# Patient Record
Sex: Female | Born: 1960 | Hispanic: No | Marital: Single | Smoking: Former smoker
Health system: Southern US, Community
[De-identification: ages and names within clinical notes are randomized; demographics above are authoritative.]

---

## 1998-05-12 HISTORY — PX: TUBAL LIGATION: SHX77

## 1998-06-10 ENCOUNTER — Inpatient Hospital Stay (HOSPITAL_COMMUNITY): Admission: AD | Admit: 1998-06-10 | Discharge: 1998-06-12 | Payer: Self-pay | Admitting: Obstetrics and Gynecology

## 1998-08-08 ENCOUNTER — Other Ambulatory Visit: Admission: RE | Admit: 1998-08-08 | Discharge: 1998-08-08 | Payer: Self-pay | Admitting: *Deleted

## 2000-06-04 ENCOUNTER — Encounter: Payer: Self-pay | Admitting: Internal Medicine

## 2000-06-04 ENCOUNTER — Emergency Department (HOSPITAL_COMMUNITY): Admission: EM | Admit: 2000-06-04 | Discharge: 2000-06-04 | Payer: Self-pay | Admitting: Internal Medicine

## 2000-12-05 ENCOUNTER — Ambulatory Visit (HOSPITAL_COMMUNITY): Admission: RE | Admit: 2000-12-05 | Discharge: 2000-12-05 | Payer: Self-pay | Admitting: Family Medicine

## 2000-12-05 ENCOUNTER — Encounter: Payer: Self-pay | Admitting: Family Medicine

## 2001-02-11 ENCOUNTER — Ambulatory Visit (HOSPITAL_COMMUNITY): Admission: RE | Admit: 2001-02-11 | Discharge: 2001-02-11 | Payer: Self-pay | Admitting: Internal Medicine

## 2001-02-11 ENCOUNTER — Encounter: Payer: Self-pay | Admitting: Internal Medicine

## 2006-02-05 ENCOUNTER — Emergency Department (HOSPITAL_COMMUNITY): Admission: EM | Admit: 2006-02-05 | Discharge: 2006-02-05 | Payer: Self-pay | Admitting: Family Medicine

## 2008-07-25 ENCOUNTER — Ambulatory Visit: Payer: Self-pay | Admitting: Occupational Medicine

## 2008-07-25 LAB — CONVERTED CEMR LAB
Bilirubin Urine: NEGATIVE
Glucose, Urine, Semiquant: NEGATIVE
Ketones, urine, test strip: NEGATIVE
Nitrite: NEGATIVE
Protein, U semiquant: NEGATIVE
Specific Gravity, Urine: 1.01
Urobilinogen, UA: 0.2
WBC Urine, dipstick: NEGATIVE
pH: 6

## 2008-08-23 ENCOUNTER — Ambulatory Visit: Payer: Self-pay | Admitting: Occupational Medicine

## 2008-08-23 DIAGNOSIS — S92919A Unspecified fracture of unspecified toe(s), initial encounter for closed fracture: Secondary | ICD-10-CM | POA: Insufficient documentation

## 2009-09-19 IMAGING — CR DG TOE 5TH 2+V*L*
1 series · 1 of 1 positions shown · non-contrast
Comparison: 08/23/2008 at 6777 hours

CLINICAL DATA: Post reduction views

LEFT TOE - 2+ VIEW

[view not recorded]
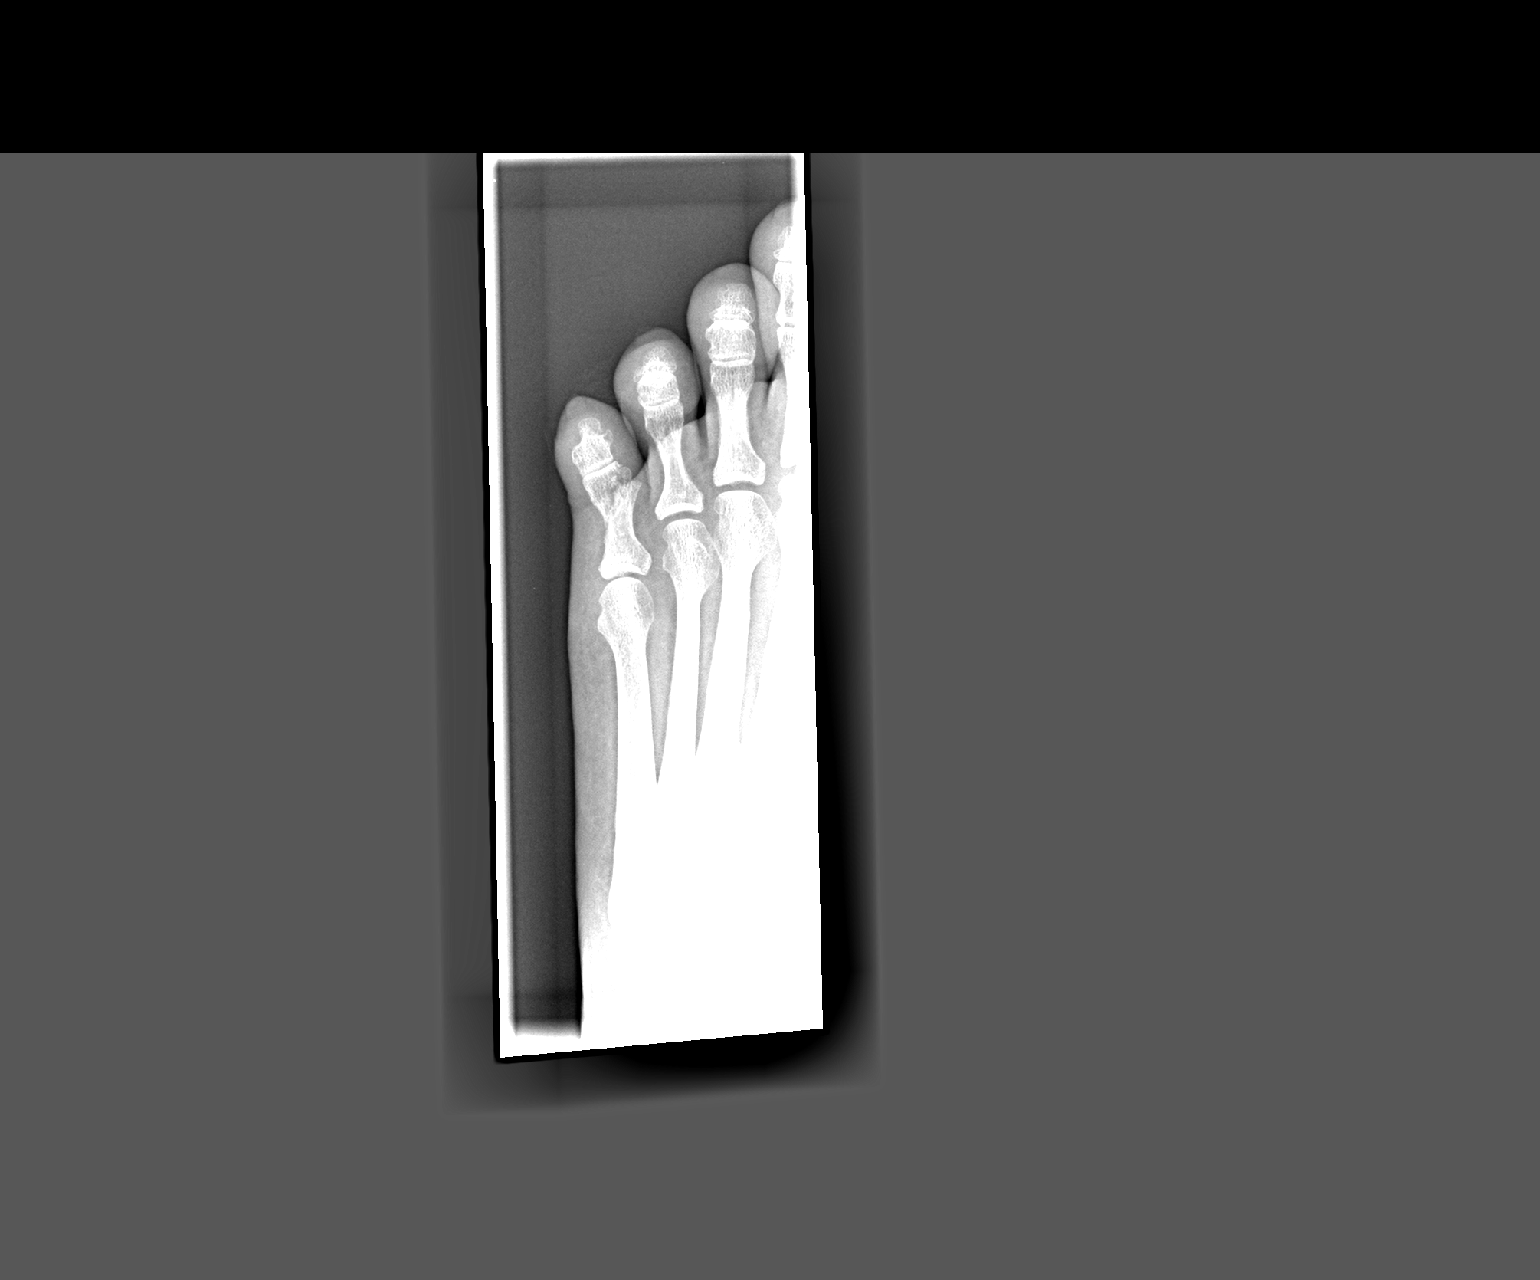

[1 of 1 positions shown; findings below may reference images not displayed]

FINDINGS: An AP views shows slightly less angulation of the
fracture involving the proximal phalanx of the fifth digit.
IMPRESSION: Slightly less angulation of the fracture of the proximal phalanx of
the fifth toe, post reduction.

## 2010-03-18 ENCOUNTER — Ambulatory Visit: Payer: Self-pay | Admitting: Cardiology

## 2014-05-15 ENCOUNTER — Ambulatory Visit (INDEPENDENT_AMBULATORY_CARE_PROVIDER_SITE_OTHER): Payer: 59 | Admitting: Obstetrics & Gynecology

## 2014-05-15 ENCOUNTER — Encounter: Payer: Self-pay | Admitting: Obstetrics & Gynecology

## 2014-05-15 VITALS — BP 160/94 | HR 99 | Resp 16 | Ht 64.0 in | Wt 178.0 lb

## 2014-05-15 DIAGNOSIS — I10 Essential (primary) hypertension: Secondary | ICD-10-CM

## 2014-05-15 DIAGNOSIS — Z01419 Encounter for gynecological examination (general) (routine) without abnormal findings: Secondary | ICD-10-CM

## 2014-05-15 DIAGNOSIS — R102 Pelvic and perineal pain: Secondary | ICD-10-CM

## 2014-05-15 DIAGNOSIS — Z124 Encounter for screening for malignant neoplasm of cervix: Secondary | ICD-10-CM

## 2014-05-15 DIAGNOSIS — Z1151 Encounter for screening for human papillomavirus (HPV): Secondary | ICD-10-CM

## 2014-05-15 NOTE — Progress Notes (Signed)
  Subjective:    Nichole Abbott is a 54 y.o. female who presents for an annual exam. The patient complaints of central pelvic pain starting in November 2015 and continued to worsen and become more constant.  Pt denies pain with BM or urination.  No N/V/D; no blod in stools, no constipation.  Pt is menopausal for 3 years approximately.  She has had menopasual symptoms for approximately 5 years.  Pt has hd no PMB.  P had not accessed the health care system for many years.  She has never had a mamogram, no poap for 16 years.   No primary care MD. The patient is not currently sexually active. GYN screening history: no pap for 16 years.  No history of mammogram. The patient wears seatbelts: yes. The patient participates in regular exercise: no. Has the patient ever been transfused or tattooed?: not asked.   Menstrual History: OB History    Gravida Para Term Preterm AB TAB SAB Ectopic Multiple Living   No LMP recorded. Patient is postmenopausal.    The following portions of the patient's history were reviewed and updated as appropriate: allergies, current medications, past family history, past medical history, past social history, past surgical history and problem list.  Review of Systems Pertinent items are noted in HPI.    Objective:      Filed Vitals:   05/15/14 1334  BP: 160/94  Pulse: 99  Resp: 16  Height:  (1.626 m)  Weight: 178 lb (80.74 kg)   Vitals:  WNL General appearance: alert, cooperative and no distress Head: Normocephalic, without obvious abnormality, atraumatic Eyes: negative Throat: lips, mucosa, and tongue normal; teeth and gums normal Lungs: clear to auscultation bilaterally Breasts: normal appearance, no masses or tenderness, No nipple retraction or dimpling, No nipple discharge or bleeding Heart: regular rate and rhythm Abdomen: soft, non-tender; bowel sounds normal; no masses,  no organomegaly  Pelvic:  External Genitalia:  Tanner V, no  lesion Urethra:  No prolapse Vagina:  Pink, normal rugae, no blood or discharge Cervix:  No CMT, no lesion Uterus:  Normal size and contour, midly tender--very limited by body habitus Adnexa:  Normal, no masses, mildly tender Rectovaginal Septum:  Non tender, no masses  Extremities: no edema, redness or tenderness in the calves or thighs Skin: no lesions or rash Lymph nodes: Axillary adenopathy: none     .    Assessment:    Healthy female exam.    Plan:     Pap with co testing Mammogram Referral to PCP for HTN Pelvic and TV US

## 2014-05-17 LAB — CYTOLOGY - PAP

## 2014-05-24 ENCOUNTER — Ambulatory Visit (INDEPENDENT_AMBULATORY_CARE_PROVIDER_SITE_OTHER): Payer: 59

## 2014-05-24 DIAGNOSIS — R102 Pelvic and perineal pain: Secondary | ICD-10-CM

## 2014-05-24 DIAGNOSIS — R350 Frequency of micturition: Secondary | ICD-10-CM

## 2014-05-24 DIAGNOSIS — Z1231 Encounter for screening mammogram for malignant neoplasm of breast: Secondary | ICD-10-CM

## 2014-05-30 ENCOUNTER — Encounter: Payer: Self-pay | Admitting: Obstetrics & Gynecology

## 2014-05-30 DIAGNOSIS — R9389 Abnormal findings on diagnostic imaging of other specified body structures: Secondary | ICD-10-CM | POA: Insufficient documentation

## 2014-05-31 ENCOUNTER — Telehealth: Payer: Self-pay | Admitting: *Deleted

## 2014-05-31 DIAGNOSIS — Z01812 Encounter for preprocedural laboratory examination: Secondary | ICD-10-CM

## 2014-05-31 MED ORDER — MISOPROSTOL 200 MCG PO TABS: 200.0000 ug | ORAL_TABLET | Freq: Once | ORAL | Status: DC

## 2014-05-31 NOTE — Telephone Encounter (Signed)
-----   Message from Lesly DukesKelly H Leggett, MD sent at 05/30/2014  6:23 PM EST ----- US showed nml size uterus and no adnexal masses.  Ovaries could not visualized with confidence but not abnml is post menopausal setting.  However, lining is 5mm which is abnml in the p-m setting and needs a biopsy.  Pt needs cytotec 400 mcg prior to procedure.

## 2014-05-31 NOTE — Telephone Encounter (Signed)
-----   Message from Kelly H Leggett, MD sent at 05/30/2014  6:23 PM EST ----- US showed nml size uterus and no adnexal masses.  Ovaries could not visualized with confidence but not abnml is post menopausal setting.  However, lining is 5mm which is abnml in the p-m setting and needs a biopsy.  Pt needs cytotec 400 mcg prior to procedure. 

## 2014-06-01 ENCOUNTER — Telehealth: Payer: Self-pay | Admitting: *Deleted

## 2014-06-01 NOTE — Telephone Encounter (Signed)
Left another message on voicemail to call office to discuss test results.  Pt is going to need Endo BX

## 2014-06-13 ENCOUNTER — Encounter: Payer: Self-pay | Admitting: Obstetrics & Gynecology

## 2014-06-13 ENCOUNTER — Ambulatory Visit (INDEPENDENT_AMBULATORY_CARE_PROVIDER_SITE_OTHER): Payer: 59 | Admitting: Obstetrics & Gynecology

## 2014-06-13 VITALS — BP 159/96 | HR 112 | Resp 16 | Ht 64.0 in | Wt 179.0 lb

## 2014-06-13 DIAGNOSIS — Z0000 Encounter for general adult medical examination without abnormal findings: Secondary | ICD-10-CM

## 2014-06-13 DIAGNOSIS — Z01812 Encounter for preprocedural laboratory examination: Secondary | ICD-10-CM

## 2014-06-13 DIAGNOSIS — R938 Abnormal findings on diagnostic imaging of other specified body structures: Secondary | ICD-10-CM

## 2014-06-13 DIAGNOSIS — I10 Essential (primary) hypertension: Secondary | ICD-10-CM

## 2014-06-13 NOTE — Progress Notes (Signed)
   Subjective:    Patient ID: Nichole Abbott, female    DOB: [birth year], 54 y.o.   MRN: [id number]  HPI Pt here for endometrial biopsy.  Pt still has pelvic pain.  US was nml uterine contour and no ovarian masses.  Lining noted to be thickened.  Needs sampling.  Pt has hypertension.  Needs primary care referral and needs colonoscopy.     Review of Systems    Continued abdominal pain No PMB  Objective:   Physical Exam  Constitutional: She is oriented to person, place, and time. She appears well-developed and well-nourished.  HENT:  Head: Normocephalic and atraumatic.  Eyes: Conjunctivae are normal.  Pulmonary/Chest: Effort normal.  Abdominal: Soft. She exhibits no distension. There is no tenderness.  Genitourinary:  Pale pink vagina Cervix--no lesion Nml uterus.  Musculoskeletal: She exhibits no edema.  Neurological: She is alert and oriented to person, place, and time.  Skin: Skin is warm and dry.  Psychiatric: She has a normal mood and affect.  Vitals reviewed.   Filed Vitals:   06/13/14 1445  BP: 159/96  Pulse: 112  Resp: 16  Height: 5\' 4"  (1.626 m)  Weight: 179 lb (81.194 kg)    ENDOMETRIAL BIOPSY     The indications for endometrial biopsy were reviewed.   Risks of the biopsy including cramping, bleeding, infection, uterine perforation, inadequate specimen and need for additional procedures  were discussed. The patient states she understands and agrees to undergo procedure today. Consent was signed. Time out was performed.  A sterile speculum was placed in the patient's vagina and the cervix was prepped with Betadine. A single-toothed tenaculum was placed on the anterior lip of the cervix to stabilize it. The 3 mm pipelle was introduced into the endometrial cavity without difficulty to a depth of 8.5cm, and a moderate amount of tissue was obtained and sent to pathology. The instruments were removed from the patient's vagina. Minimal bleeding from the cervix was noted. The  patient tolerated the procedure well. Routine post-procedure instructions were given to the patient. The patient will follow up to review the results and for further management.         Assessment & Plan:  54 yo female with thickened endometrium on US Endometrial biopsy today. Needs primary care referral for hypertension and abdominal pain.  Colonoscopy. Will call with results. Provera 14 days x 2 months if nml

## 2014-06-19 ENCOUNTER — Other Ambulatory Visit: Payer: Self-pay | Admitting: Obstetrics & Gynecology

## 2014-06-19 MED ORDER — MEDROXYPROGESTERONE ACETATE 10 MG PO TABS: ORAL_TABLET | ORAL | Status: DC

## 2014-06-20 ENCOUNTER — Telehealth: Payer: Self-pay | Admitting: *Deleted

## 2014-06-20 NOTE — Telephone Encounter (Signed)
-----   Message from Lesly DukesKelly H Leggett, MD sent at 06/19/2014  9:58 PM EST ----- Nml endometrial biopsy.  Treat with provera x 14 days for two months.

## 2014-06-20 NOTE — Telephone Encounter (Signed)
Called pt to adv endo bx nrml and provera sent to pharm. Pt expressed understanding.

## 2015-06-20 IMAGING — US US PELVIS COMPLETE
1 series · 14 of 25 positions shown · non-contrast
Comparison: None

CLINICAL DATA: Pelvic pain in female. Urinary frequency.
Postmenopausal female.

EXAM:
TRANSABDOMINAL AND TRANSVAGINAL ULTRASOUND OF PELVIS
TECHNIQUE: Both transabdominal and transvaginal ultrasound examinations of the
pelvis were performed. Transabdominal technique was performed for
global imaging of the pelvis including uterus, ovaries, adnexal
regions, and pelvic cul-de-sac. It was necessary to proceed with
endovaginal exam following the transabdominal exam to visualize the
endometrium and ovaries.

[Series 1: us pelvis complete · 0.21mm/px · 14 of 43 slices shown]
[im 1/43]
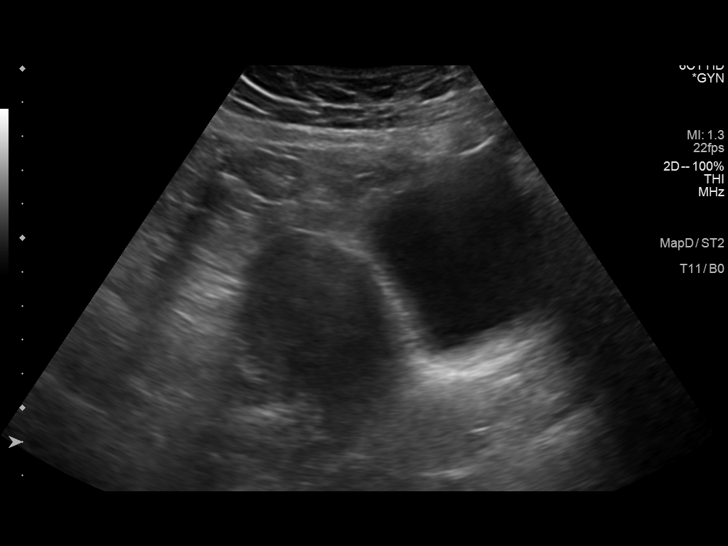
[im 4/43]
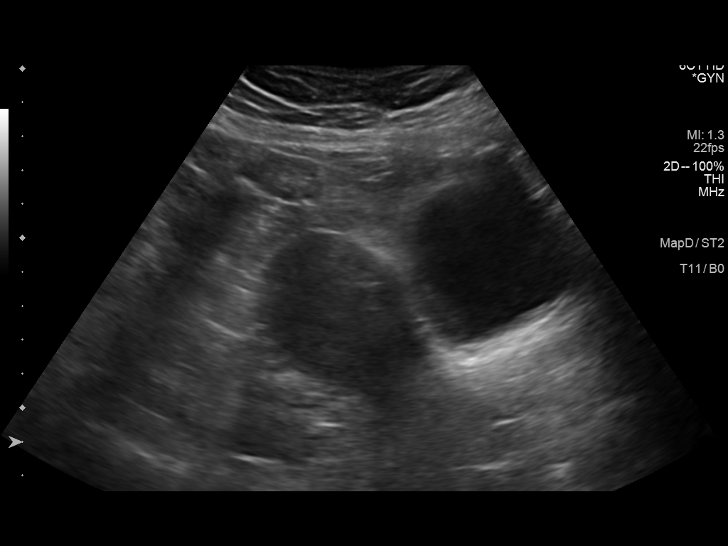
[im 8/43]
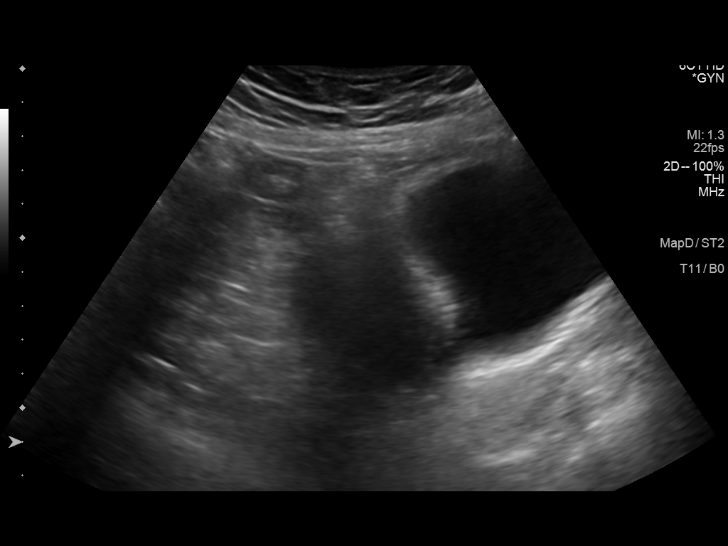
[im 11/43]
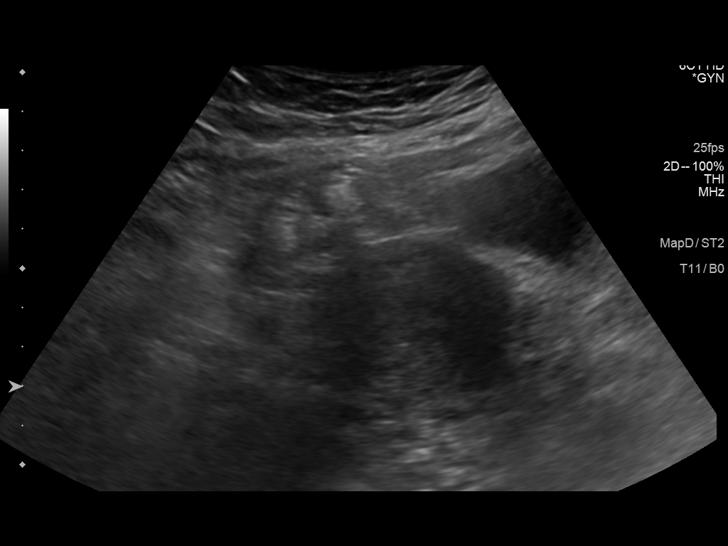
[im 15/43]
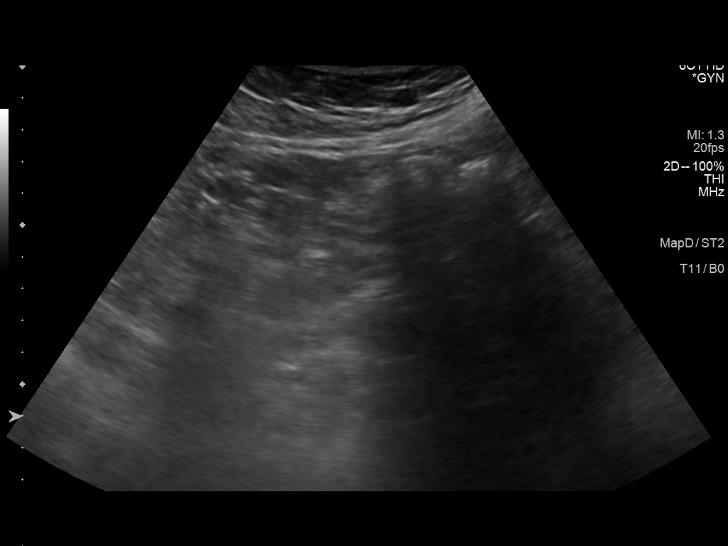
[im 16/43]
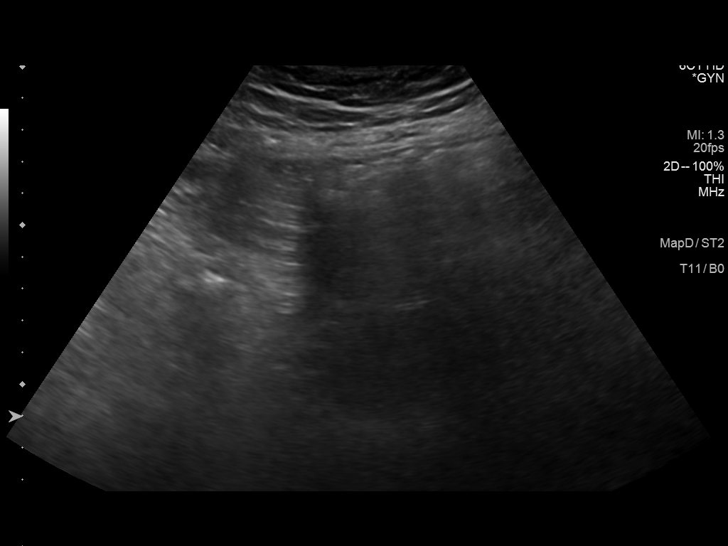
[im 20/43]
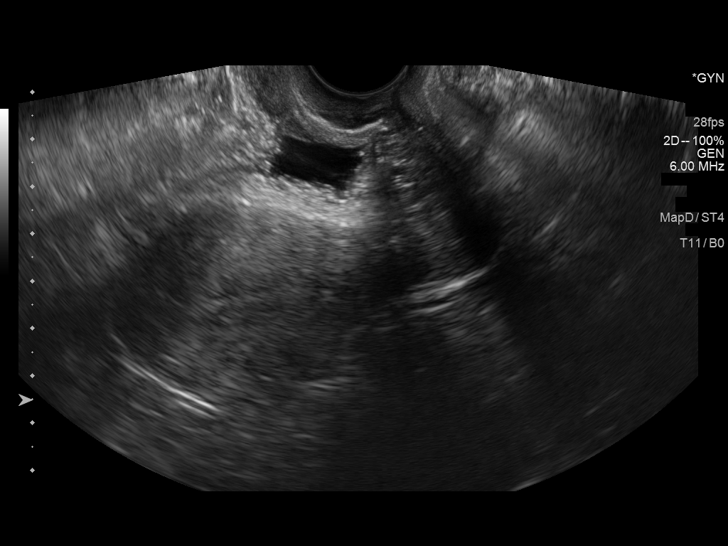
[im 23/43]
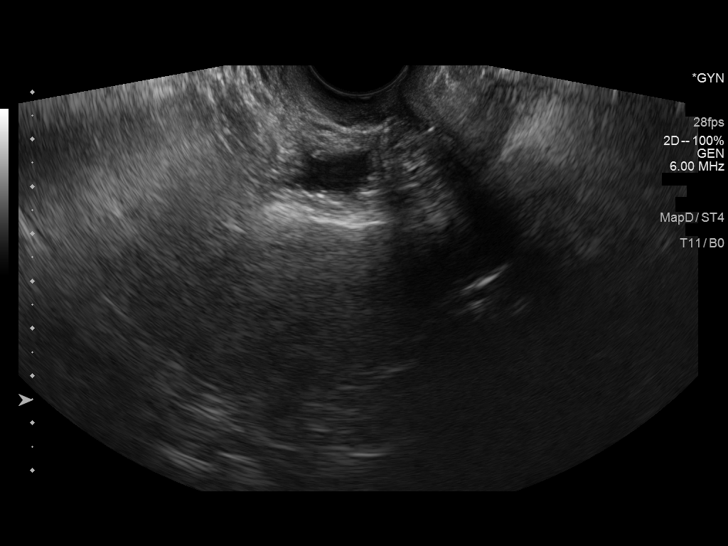
[im 27/43]
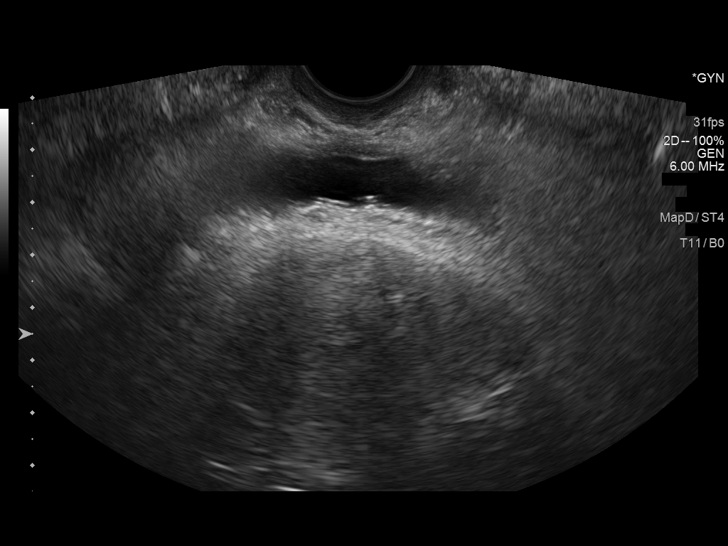
[im 29/43]
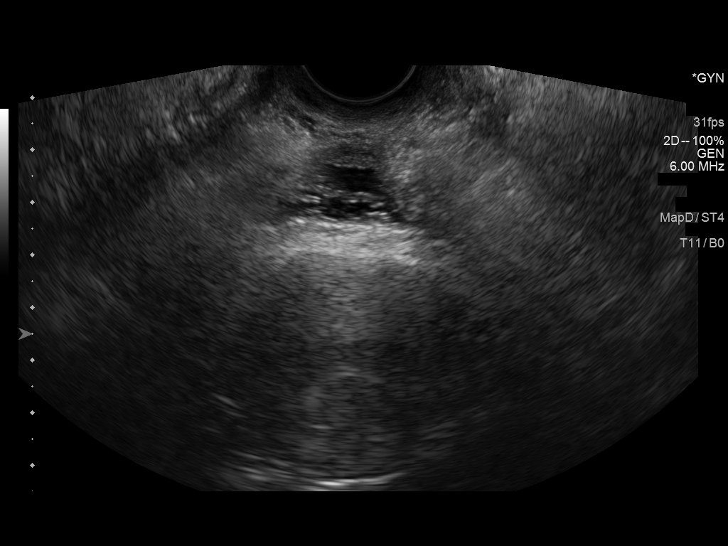
[im 32/43]
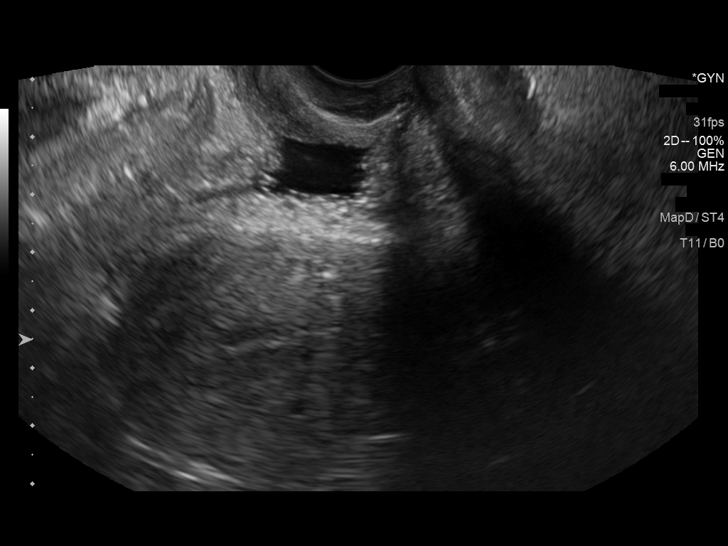
[im 36/43]
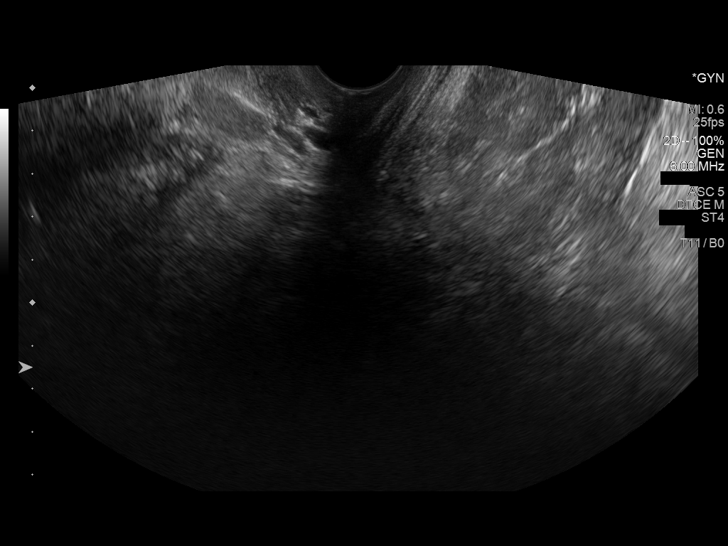
[im 39/43]
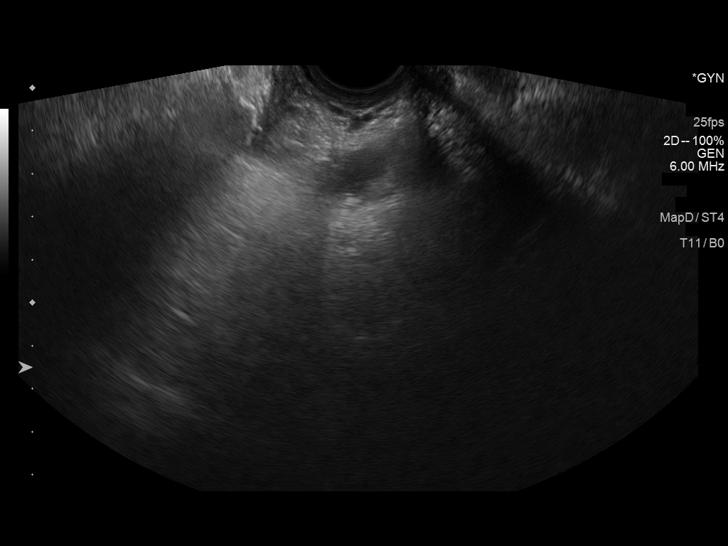
[im 43/43]
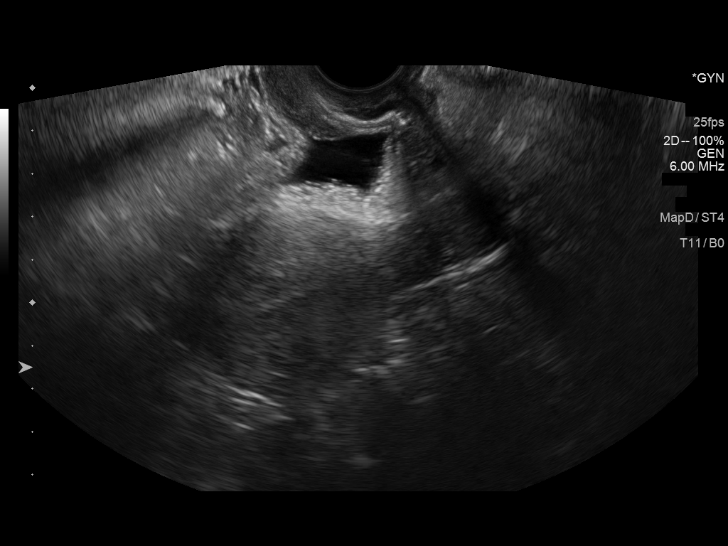

[14 of 25 positions shown; findings below may reference images not displayed]

FINDINGS: Uterus

Measurements: 8.8 x 3.8 x 4.9 cm. No fibroids or other mass
visualized.

Endometrium

Thickness: 5 mm.  No focal abnormality visualized.

Right ovary

Measurements: Not directly visualized by transabdominal or
transvaginal sonography, however no adnexal mass identified.

Left ovary

Measurements: Not directly visualized by transabdominal or
transvaginal sonography, however no adnexal mass identified.

Other findings

No free fluid.
IMPRESSION: Normal appearance of uterus.  No fibroids identified.

Nonvisualization of ovaries, however no adnexal mass identified.

## 2015-08-23 ENCOUNTER — Ambulatory Visit (INDEPENDENT_AMBULATORY_CARE_PROVIDER_SITE_OTHER): Payer: 59 | Admitting: Osteopathic Medicine

## 2015-08-23 ENCOUNTER — Encounter: Payer: Self-pay | Admitting: Osteopathic Medicine

## 2015-08-23 VITALS — BP 110/60 | HR 82 | Ht 64.0 in | Wt 164.0 lb

## 2015-08-23 DIAGNOSIS — Z1211 Encounter for screening for malignant neoplasm of colon: Secondary | ICD-10-CM | POA: Insufficient documentation

## 2015-08-23 DIAGNOSIS — Z139 Encounter for screening, unspecified: Secondary | ICD-10-CM | POA: Diagnosis not present

## 2015-08-23 DIAGNOSIS — Z780 Asymptomatic menopausal state: Secondary | ICD-10-CM | POA: Diagnosis not present

## 2015-08-23 DIAGNOSIS — R1032 Left lower quadrant pain: Secondary | ICD-10-CM

## 2015-08-23 NOTE — Progress Notes (Signed)
HPI: Nichole Abbott is a 55 y.o. female who presents to [facility] today for chief complaint of:  Chief Complaint  Patient presents with  . Establish Care    would like colonoscopy scheduled    ABDOMINAL PAIN  . Location: LLQ  . Quality: sharp  . Duration: lasted about few days, started last week, went away on its own And today she is feeling fine . Context: mother had colon cancer diagnosed in her 70's so patient is worried that she might need a colonoscopy . Assoc signs/symptoms: no D+, no C-    Past medical, social and family history reviewed: History reviewed. No pertinent past medical history. Past Surgical History  Procedure Laterality Date  . Tubal ligation  05/1998   Social History  Substance Use Topics  . Smoking status: Former Smoker -- 20 years    Quit date: 05/15/2013  . Smokeless tobacco: Never Used  . Alcohol Use: 1.2 oz/week    2 Standard drinks or equivalent per week     Comment: occasional   Family History  Problem Relation Age of Onset  . Colon cancer Mother   . Pulmonary fibrosis Father   . Hypertension Father   . COPD Sister   . Diabetes Mother     Current Outpatient Prescriptions  Medication Sig Dispense Refill  . Multiple Vitamin (MULTIVITAMINS PO) Take by mouth.     No current facility-administered medications for this visit.   No Known Allergies    Review of Systems: CONSTITUTIONAL:  No  fever, no chills, No  unintentional weight changes HEAD/EYES/EARS/NOSE/THROAT: No  headache, no vision change, no hearing change, No  sore throat, No  sinus pressure CARDIAC: No  chest pain, No  pressure, No palpitations, No  orthopnea RESPIRATORY: No  cough, No  shortness of breath/wheeze GASTROINTESTINAL: No  nausea, No  vomiting, (+) abdominal pain As per history of present illness which has since resolved, No  blood in stool, No  diarrhea, No  constipation  MUSCULOSKELETAL: No  myalgia/arthralgia GENITOURINARY: No   incontinence, No  abnormal genital bleeding/discharge SKIN: No  rash/wounds/concerning lesions HEM/ONC: No  easy bruising/bleeding, No  abnormal lymph node ENDOCRINE: No polyuria/polydipsia/polyphagia, No  heat/cold intolerance  NEUROLOGIC: No  weakness, No  dizziness, No  slurred speech PSYCHIATRIC: No  concerns with depression, No  concerns with anxiety, No sleep problems  Exam:  BP 110/60 mmHg  Pulse 82  Ht  (1.626 m)  Wt 164 lb (74.39 kg)  BMI 28.14 kg/m2 Constitutional: VS see above. General Appearance: alert, well-developed, well-nourished, NAD Eyes: Normal lids and conjunctive, non-icteric sclera Ears, Nose, Mouth, Throat: MMM, Normal external inspection ears/nares/mouth/lips/gums Neck: No masses, trachea midline. No thyroid enlargement/tenderness/mass appreciated. No lymphadenopathy Respiratory: Normal respiratory effort. no wheeze, no rhonchi, no rales Cardiovascular: S1/S2 normal, no murmur, no rub/gallop auscultated. RRR. No lower extremity edema. Gastrointestinal: Nontender, no masses. No hepatomegaly, no splenomegaly. No hernia appreciated. Bowel sounds normal. Rectal exam deferred.  Musculoskeletal: Gait normal. No clubbing/cyanosis of digits.  Neurological: No cranial nerve deficit on limited exam. Motor and sensation intact and symmetric Skin: warm, dry, intact.    No results found for this or any previous visit (from the past 72 hour(s)).    ASSESSMENT/PLAN: Patient advised that since her mother had colon cancer diagnosed in her 78s, patient does not need early or any other specific colon cancer screening, however since she is over 61 and has not had cancer screening we should initiate this. Options  discussed with patient including Cologuard, colonoscopy, fecal occult blood test. Patient opts for cold guard. I think this is reasonable rather than a colonoscopy at this point since her symptoms have resolved. See patient instructions. We'll also go ahead and order  labs for patient to have done and come back for annual wellness exam. ER/RTC precautions reviewed with regard to abdominal pain/other concerns  Abdominal pain, left lower quadrant - Plan: CBC with Differential/Platelet, COMPLETE METABOLIC PANEL WITH GFR, TSH  Colon cancer screening - Plan: Cologuard  Screening - Plan: CBC with Differential/Platelet, COMPLETE METABOLIC PANEL WITH GFR, Lipid panel, TSH, VITAMIN D 25 Hydroxy (Vit-D Deficiency, Fractures)  Postmenopausal - Plan: VITAMIN D 25 Hydroxy (Vit-D Deficiency, Fractures)   Return if symptoms return/worsen, and at your convenience for annual wellness exam and review lab results.

## 2015-08-23 NOTE — Patient Instructions (Signed)
You have opted to undergo Cologuard testing for routine colon cancer screening - please call to make sure that this is covered before we officially place the order, went to find out whether or not it is covered, please call our office so that we can send in order and that you should get your care in the mail. Please let us know if you don't. If abdominal pain returns, changes or worsens, please come back to the clinic or if severe pain please go to the nearest emergency room. Otherwise, lab work is also ordered for routine screening for things like cholesterol, blood counts, liver and kidneys, thyroid and others. You're encouraged to get this done fasting at your convenience and to schedule an annual wellness exam to go over these results in detail as well as to ensure all other preventive care measures are met based on your age and other risk factors.  It is anything else we can do for you, please do not hesitate to call the office. Take care! -Dr. Loni Muse.
